# Patient Record
Sex: Female | Born: 1935 | Marital: Married
Health system: Southern US, Community
[De-identification: ages and names within clinical notes are randomized; demographics above are authoritative.]

---

## 2004-11-17 ENCOUNTER — Emergency Department: Payer: Self-pay | Admitting: Unknown Physician Specialty

## 2005-04-01 ENCOUNTER — Emergency Department: Payer: Self-pay | Admitting: Emergency Medicine

## 2005-04-01 ENCOUNTER — Other Ambulatory Visit: Payer: Self-pay

## 2005-08-30 ENCOUNTER — Emergency Department: Payer: Self-pay | Admitting: Emergency Medicine

## 2005-11-26 ENCOUNTER — Other Ambulatory Visit: Payer: Self-pay

## 2005-11-26 ENCOUNTER — Inpatient Hospital Stay: Payer: Self-pay | Admitting: Unknown Physician Specialty

## 2005-12-29 ENCOUNTER — Other Ambulatory Visit: Payer: Self-pay

## 2005-12-29 ENCOUNTER — Emergency Department: Payer: Self-pay | Admitting: Emergency Medicine

## 2006-01-07 ENCOUNTER — Other Ambulatory Visit: Payer: Self-pay

## 2006-01-07 ENCOUNTER — Emergency Department: Payer: Self-pay | Admitting: Emergency Medicine

## 2006-01-08 ENCOUNTER — Emergency Department: Payer: Self-pay | Admitting: Emergency Medicine

## 2006-01-08 ENCOUNTER — Other Ambulatory Visit: Payer: Self-pay

## 2006-05-03 ENCOUNTER — Ambulatory Visit: Payer: Self-pay | Admitting: Family Medicine

## 2006-05-12 ENCOUNTER — Ambulatory Visit: Payer: Self-pay | Admitting: Family Medicine

## 2006-06-20 ENCOUNTER — Ambulatory Visit: Payer: Self-pay | Admitting: Surgery

## 2006-07-12 ENCOUNTER — Ambulatory Visit: Payer: Self-pay | Admitting: Surgery

## 2006-07-19 ENCOUNTER — Ambulatory Visit: Payer: Self-pay | Admitting: Surgery

## 2006-07-29 ENCOUNTER — Emergency Department: Payer: Self-pay | Admitting: Emergency Medicine

## 2007-02-27 IMAGING — US US  BREAST BX W/ LOC DEV 1ST LESION IMG BX SPEC US GUIDE*R*
1 series · 17 of 17 positions shown · non-contrast
Comparison: none

REASON FOR EXAM: Rt Breast Mass
COMMENTS:

[Series 1: us breast bx w/ loc dev 1st lesion img bx spec us  · 17 of 17 slices shown]
[im 1/17]
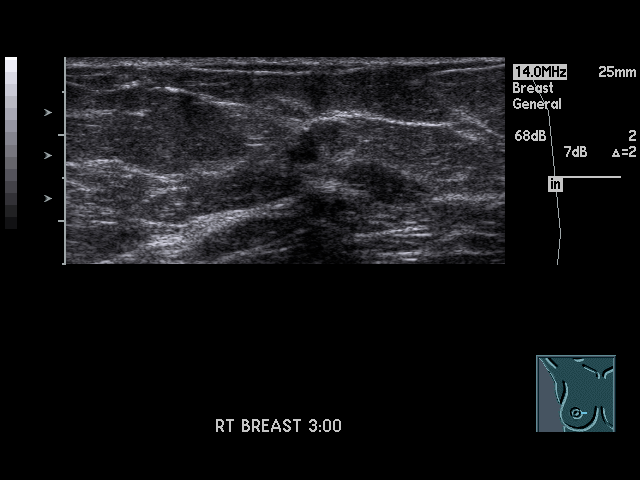
[im 2/17]
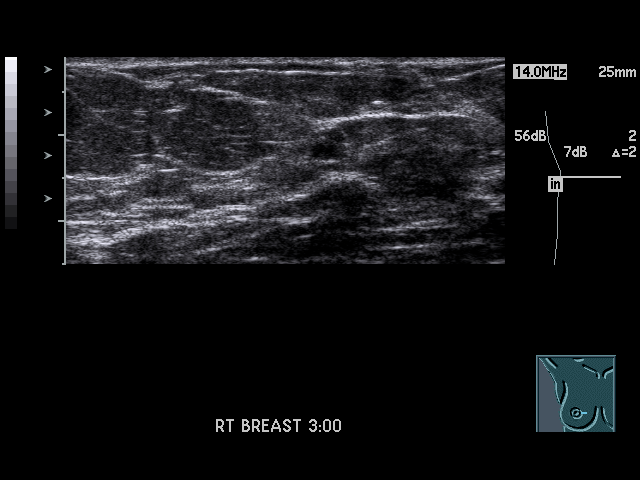
[im 3/17]
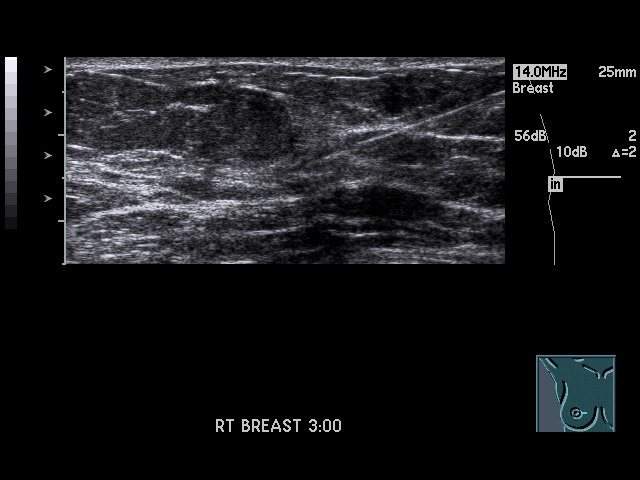
[im 4/17]
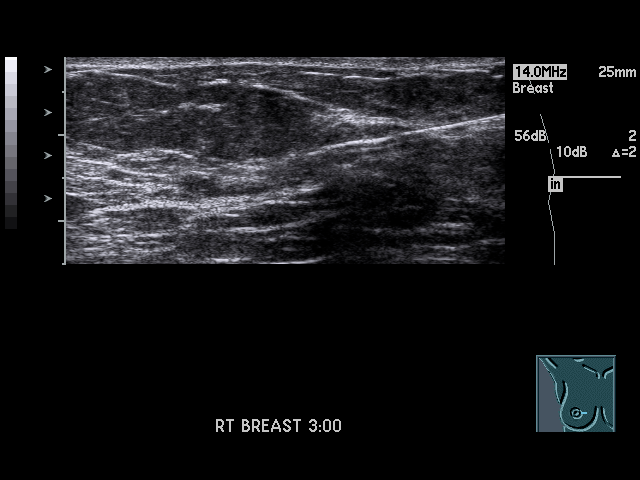
[im 5/17]
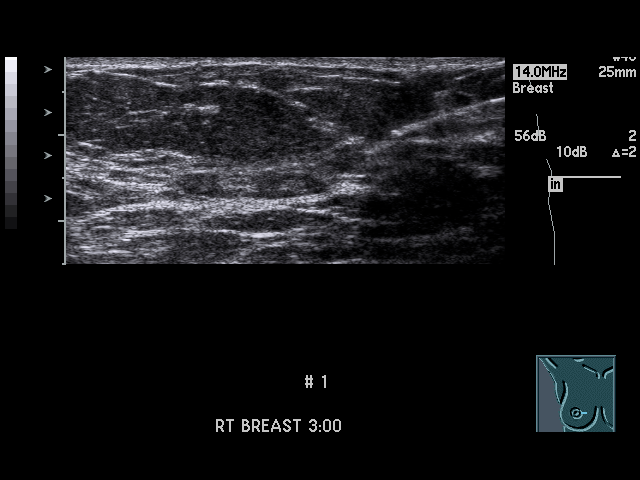
[im 6/17]
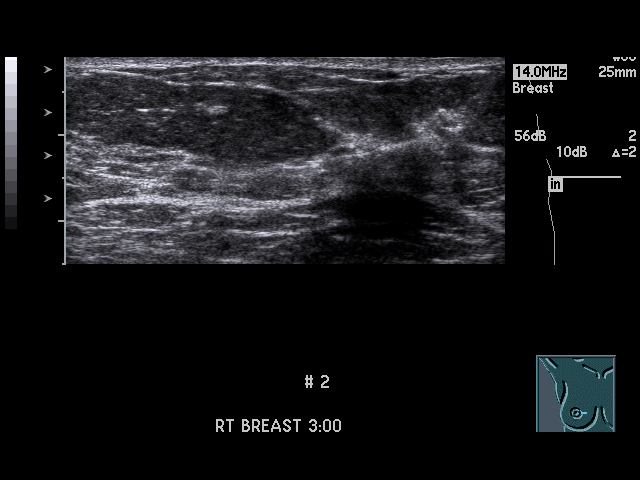
[im 7/17]
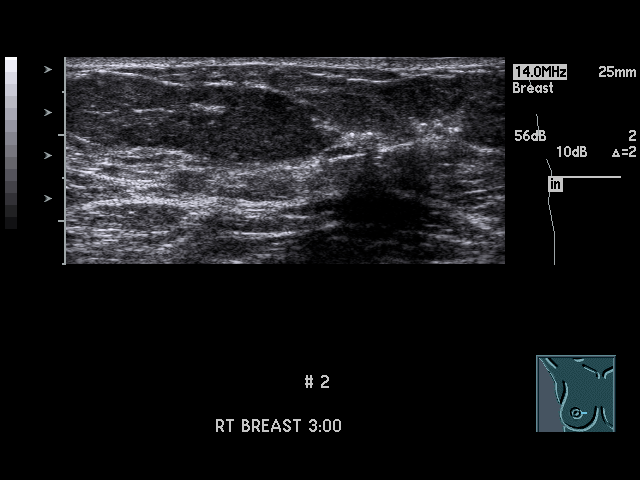
[im 8/17]
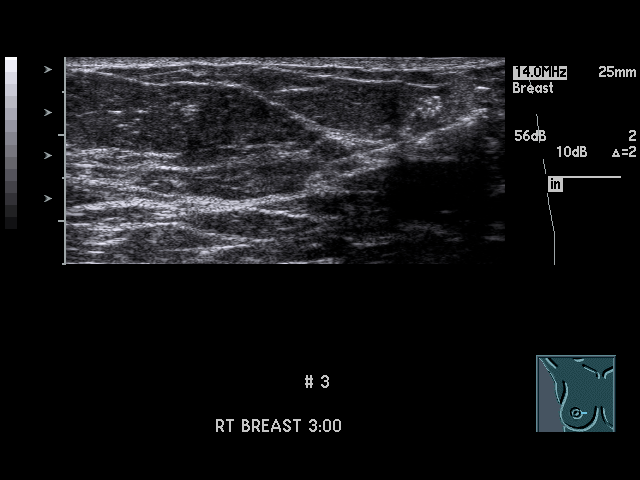
[im 9/17]
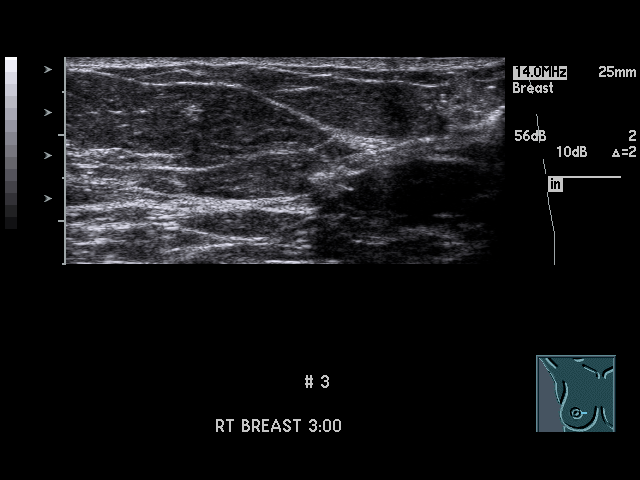
[im 10/17]
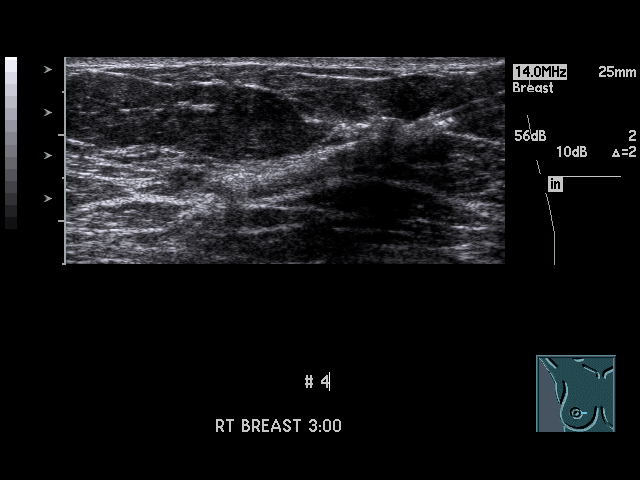
[im 11/17]
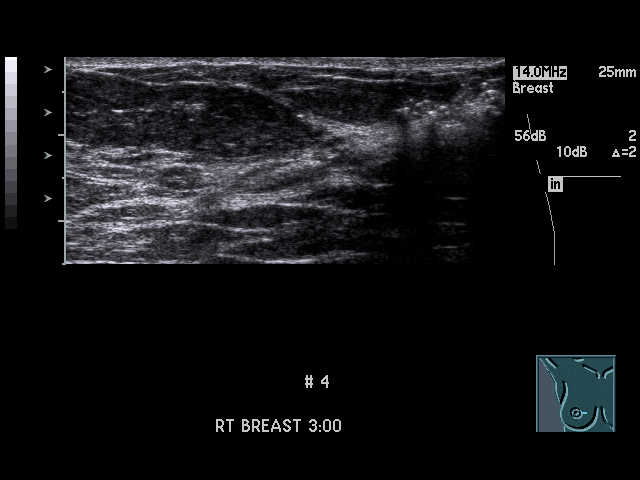
[im 12/17]
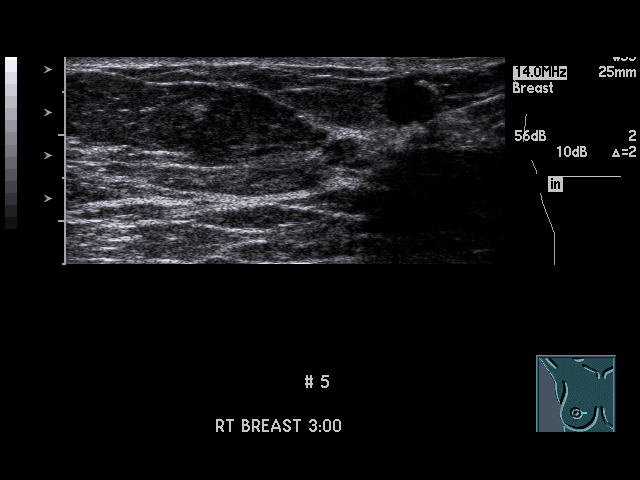
[im 13/17]
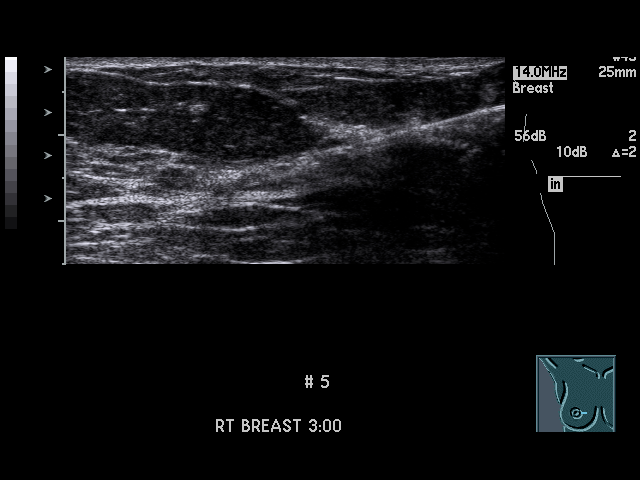
[im 14/17]
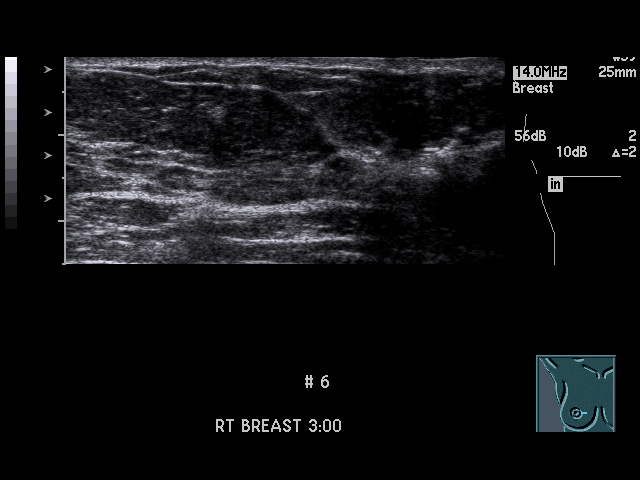
[im 15/17]
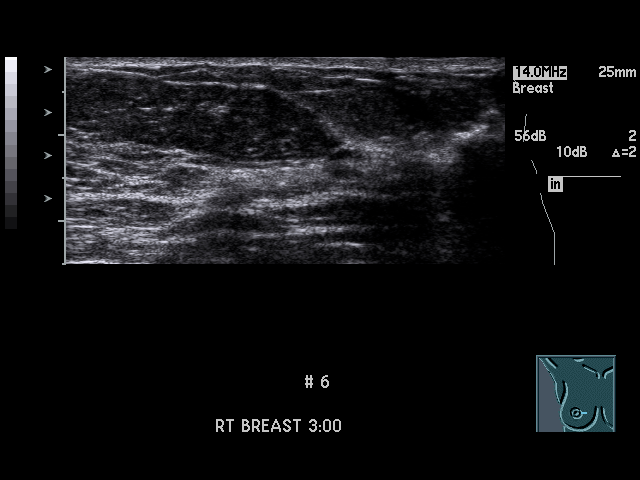
[im 16/17]
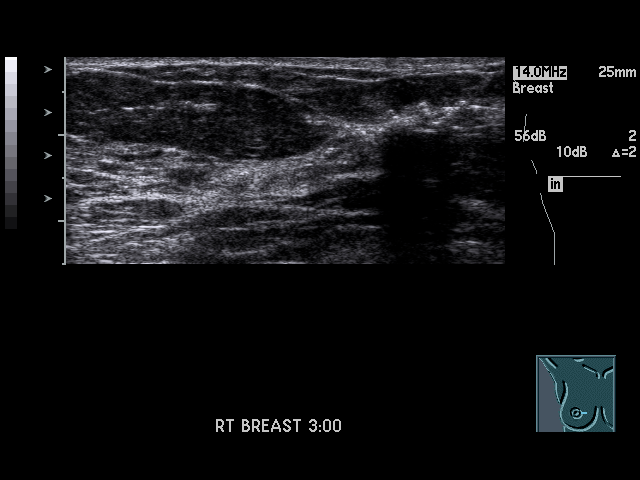
[im 17/17]
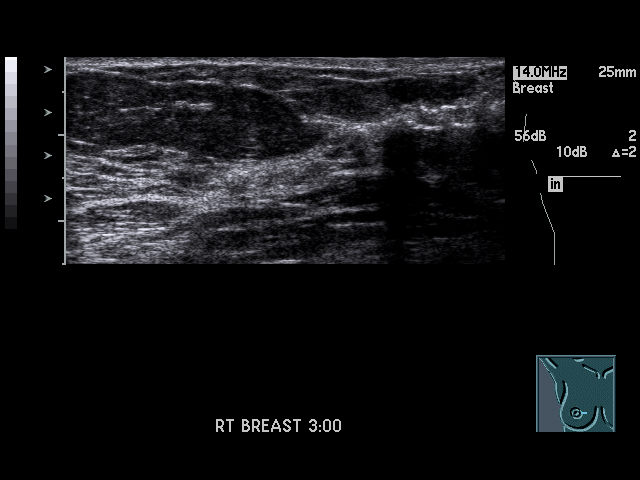

[17 of 17 positions shown; findings below may reference images not displayed]

PROCEDURE:     US  - US GUIDED BIOPSY BREAST RIGHT  - June 20, 2006  [DATE]

RESULT:     The patient was informed of the risks and benefits of the
procedure and proper informed consent was obtained.  The patient was brought
to the ultrasound suite and the RIGHT breast was evaluated.  A small
hypoechoic nodule is identified at the 3 o'clock position.  Proper entry
site for ultrasound guided biopsy and possible aspiration was established.
The overlying soft tissues were then prepped and draped in the usual sterile
fashion.  The entry site was anesthetized with approximately 6 cm of 1%
lidocaine without epinephrine.  A small dermatotomy was formed at the entry
site.  The small hypoechoic nodule was then cannulated with an 18-gauge 5-cm
Achieve spinal needle.  An attempt was made to aspirate the small hypoechoic
nodule which was unsuccessful.

Approximately 6 passes were made into the small hypoechoic nodule with a
16-gauge, 11-cm Achieve core biopsy needle.  The samples were sent to the
pathology lab for analysis.  The patient tolerated the procedure without
complications.  There is no evidence of free fluid or drainable loculated
fluid collections.  Small radiopaque as well as echogenic markers were
placed in the region of biopsy status post conclusion of the biopsy. The
patient tolerated the procedure without complications.
IMPRESSION: Ultrasound guided RIGHT breast biopsy as described above.

The patient tolerated the procedure without complications.  The patient was
monitored and discharged with an ice pack to apply to the biopsy site.

## 2008-04-02 ENCOUNTER — Emergency Department: Payer: Self-pay | Admitting: Emergency Medicine

## 2008-08-01 ENCOUNTER — Emergency Department: Payer: Self-pay | Admitting: Internal Medicine

## 2009-05-25 ENCOUNTER — Emergency Department: Payer: Self-pay | Admitting: Emergency Medicine

## 2009-12-26 ENCOUNTER — Emergency Department: Payer: Self-pay | Admitting: Unknown Physician Specialty

## 2010-11-09 ENCOUNTER — Emergency Department: Payer: Self-pay | Admitting: Emergency Medicine

## 2010-12-13 ENCOUNTER — Emergency Department: Payer: Self-pay | Admitting: Emergency Medicine

## 2011-03-18 ENCOUNTER — Emergency Department: Payer: Self-pay | Admitting: Emergency Medicine

## 2012-06-05 ENCOUNTER — Emergency Department: Payer: Self-pay | Admitting: Emergency Medicine

## 2012-06-05 LAB — COMPREHENSIVE METABOLIC PANEL
Albumin: 3.8 g/dL (ref 3.4–5.0)
Anion Gap: 8 (ref 7–16)
BUN: 30 mg/dL — ABNORMAL HIGH (ref 7–18)
Bilirubin,Total: 0.3 mg/dL (ref 0.2–1.0)
Chloride: 102 mmol/L (ref 98–107)
Co2: 26 mmol/L (ref 21–32)
EGFR (African American): >60
Glucose: 93 mg/dL (ref 65–99)
Osmolality: 278 (ref 275–301)
Potassium: 4.3 mmol/L (ref 3.5–5.1)
SGOT(AST): 22 U/L (ref 15–37)
SGPT (ALT): 25 U/L (ref 12–78)
Sodium: 136 mmol/L (ref 136–145)
Total Protein: 8.3 g/dL — ABNORMAL HIGH (ref 6.4–8.2)

## 2012-06-05 LAB — CBC
HCT: 40.6 % (ref 35.0–47.0)
HGB: 12.8 g/dL (ref 12.0–16.0)
MCH: 29.9 pg (ref 26.0–34.0)
Platelet: 233 10*3/uL (ref 150–440)
RBC: 4.27 10*6/uL (ref 3.80–5.20)
RDW: 13.8 % (ref 11.5–14.5)
WBC: 6.9 10*3/uL (ref 3.6–11.0)

## 2012-06-05 LAB — URINALYSIS, COMPLETE
Ketone: NEGATIVE
Nitrite: NEGATIVE
Ph: 7 (ref 4.5–8.0)
Protein: NEGATIVE
RBC,UR: 1 /HPF (ref 0–5)
Squamous Epithelial: 2
WBC UR: 24 /HPF (ref 0–5)

## 2012-06-05 LAB — CK TOTAL AND CKMB (NOT AT ARMC)
CK, Total: 42 U/L (ref 21–215)
CK-MB: <0.5 ng/mL — ABNORMAL LOW (ref 0.5–3.6)

## 2012-06-05 LAB — TROPONIN I: Troponin-I: <0.02 ng/mL

## 2013-02-17 LAB — COMPREHENSIVE METABOLIC PANEL
Albumin: 3.6 g/dL (ref 3.4–5.0)
Alkaline Phosphatase: 103 U/L (ref 50–136)
Anion Gap: 11 (ref 7–16)
Bilirubin,Total: 0.4 mg/dL (ref 0.2–1.0)
Calcium, Total: 9.6 mg/dL (ref 8.5–10.1)
Chloride: 103 mmol/L (ref 98–107)
Co2: 23 mmol/L (ref 21–32)
EGFR (African American): 55 — ABNORMAL LOW
Glucose: 101 mg/dL — ABNORMAL HIGH (ref 65–99)
Sodium: 137 mmol/L (ref 136–145)
Total Protein: 7.8 g/dL (ref 6.4–8.2)

## 2013-02-17 LAB — URINALYSIS, COMPLETE
Bacteria: NONE SEEN
Blood: NEGATIVE
Glucose,UR: NEGATIVE mg/dL (ref 0–75)
Leukocyte Esterase: NEGATIVE
Ph: 5 (ref 4.5–8.0)
Protein: NEGATIVE
RBC,UR: <1 /HPF (ref 0–5)
Squamous Epithelial: NONE SEEN

## 2013-02-17 LAB — CBC WITH DIFFERENTIAL/PLATELET
Basophil #: 0 10*3/uL (ref 0.0–0.1)
Basophil %: 0.1 %
HGB: 11.8 g/dL — ABNORMAL LOW (ref 12.0–16.0)
Lymphocyte #: 0.5 10*3/uL — ABNORMAL LOW (ref 1.0–3.6)
Lymphocyte %: 7.8 %
MCH: 30 pg (ref 26.0–34.0)
MCV: 91 fL (ref 80–100)
Monocyte #: 0 x10 3/mm — ABNORMAL LOW (ref 0.2–0.9)
Monocyte %: 0.7 %
Neutrophil #: 5.3 10*3/uL (ref 1.4–6.5)
RDW: 13.1 % (ref 11.5–14.5)
WBC: 5.8 10*3/uL (ref 3.6–11.0)

## 2013-02-17 LAB — APTT: Activated PTT: 28.6 secs (ref 23.6–35.9)

## 2013-02-17 LAB — PROTIME-INR: INR: 1

## 2013-02-17 LAB — TSH: Thyroid Stimulating Horm: 0.79 u[IU]/mL

## 2013-02-17 LAB — TROPONIN I: Troponin-I: 0.4 ng/mL — ABNORMAL HIGH

## 2013-02-18 ENCOUNTER — Inpatient Hospital Stay: Payer: Self-pay | Admitting: Family Medicine

## 2013-02-18 DIAGNOSIS — I214 Non-ST elevation (NSTEMI) myocardial infarction: Secondary | ICD-10-CM

## 2013-02-18 LAB — CBC WITH DIFFERENTIAL/PLATELET
Basophil #: 0 10*3/uL (ref 0.0–0.1)
Basophil %: 0.3 %
Eosinophil #: 0 10*3/uL (ref 0.0–0.7)
HGB: 9.5 g/dL — ABNORMAL LOW (ref 12.0–16.0)
Lymphocyte #: 0.3 10*3/uL — ABNORMAL LOW (ref 1.0–3.6)
Lymphocyte %: 2.2 %
MCH: 29.9 pg (ref 26.0–34.0)
MCHC: 33.4 g/dL (ref 32.0–36.0)
MCV: 89 fL (ref 80–100)
Monocyte #: 0.4 x10 3/mm (ref 0.2–0.9)
Monocyte %: 3.6 %
Neutrophil #: 10.9 10*3/uL — ABNORMAL HIGH (ref 1.4–6.5)
Neutrophil %: 93.9 %
RBC: 3.17 10*6/uL — ABNORMAL LOW (ref 3.80–5.20)
RDW: 12.9 % (ref 11.5–14.5)
WBC: 11.6 10*3/uL — ABNORMAL HIGH (ref 3.6–11.0)

## 2013-02-18 LAB — BASIC METABOLIC PANEL
Chloride: 104 mmol/L (ref 98–107)
Creatinine: 1.59 mg/dL — ABNORMAL HIGH (ref 0.60–1.30)
EGFR (African American): 33 — ABNORMAL LOW
Glucose: 134 mg/dL — ABNORMAL HIGH (ref 65–99)
Osmolality: 284 (ref 275–301)
Sodium: 137 mmol/L (ref 136–145)

## 2013-02-18 LAB — LIPID PANEL
Cholesterol: 138 mg/dL (ref 0–200)
Ldl Cholesterol, Calc: 74 mg/dL (ref 0–100)
Triglycerides: 97 mg/dL (ref 0–200)
VLDL Cholesterol, Calc: 19 mg/dL (ref 5–40)

## 2013-02-18 LAB — TROPONIN I: Troponin-I: 12.88 ng/mL — ABNORMAL HIGH

## 2013-02-19 DIAGNOSIS — R55 Syncope and collapse: Secondary | ICD-10-CM

## 2013-02-20 ENCOUNTER — Ambulatory Visit: Payer: Self-pay | Admitting: Internal Medicine

## 2013-02-20 DIAGNOSIS — R0602 Shortness of breath: Secondary | ICD-10-CM

## 2013-02-20 LAB — CBC WITH DIFFERENTIAL/PLATELET
Basophil #: 0 10*3/uL (ref 0.0–0.1)
Basophil %: 0.1 %
Eosinophil #: 0 10*3/uL (ref 0.0–0.7)
HGB: 11.7 g/dL — ABNORMAL LOW (ref 12.0–16.0)
Lymphocyte %: 5.4 %
MCV: 89 fL (ref 80–100)
Monocyte #: 0.5 x10 3/mm (ref 0.2–0.9)
Monocyte %: 3.7 %
Neutrophil #: 11.2 10*3/uL — ABNORMAL HIGH (ref 1.4–6.5)
Neutrophil %: 90.8 %
RBC: 3.92 10*6/uL (ref 3.80–5.20)
RDW: 13.3 % (ref 11.5–14.5)

## 2013-02-20 LAB — BASIC METABOLIC PANEL
BUN: 40 mg/dL — ABNORMAL HIGH (ref 7–18)
Calcium, Total: 8.6 mg/dL (ref 8.5–10.1)
Co2: 19 mmol/L — ABNORMAL LOW (ref 21–32)
Creatinine: 1.31 mg/dL — ABNORMAL HIGH (ref 0.60–1.30)
EGFR (African American): 42 — ABNORMAL LOW
EGFR (Non-African Amer.): 36 — ABNORMAL LOW
Glucose: 126 mg/dL — ABNORMAL HIGH (ref 65–99)
Osmolality: 283 (ref 275–301)
Potassium: 4.5 mmol/L (ref 3.5–5.1)

## 2013-02-20 LAB — TROPONIN I: Troponin-I: 5.08 ng/mL — ABNORMAL HIGH

## 2013-02-21 LAB — BASIC METABOLIC PANEL
BUN: 47 mg/dL — ABNORMAL HIGH (ref 7–18)
Calcium, Total: 8.9 mg/dL (ref 8.5–10.1)
Chloride: 106 mmol/L (ref 98–107)
Creatinine: 1.54 mg/dL — ABNORMAL HIGH (ref 0.60–1.30)
EGFR (African American): 34 — ABNORMAL LOW
EGFR (Non-African Amer.): 30 — ABNORMAL LOW
Glucose: 148 mg/dL — ABNORMAL HIGH (ref 65–99)
Osmolality: 293 (ref 275–301)
Potassium: 4.1 mmol/L (ref 3.5–5.1)
Sodium: 139 mmol/L (ref 136–145)

## 2013-03-23 ENCOUNTER — Ambulatory Visit: Payer: Self-pay | Admitting: Internal Medicine

## 2013-03-23 DEATH — deceased

## 2013-10-27 IMAGING — CT CT HEAD WITHOUT CONTRAST
1 series · 16 of 30 positions shown, 20 images · non-contrast
Comparison: none

REASON FOR EXAM: c/o headache, sleepy
COMMENTS:

[Series 2: soft tissue · axial · 0.42mm/px · z∈[-201,-56]mm · 16 of 33 slices shown, 20 images]
[im 2/33  brain]
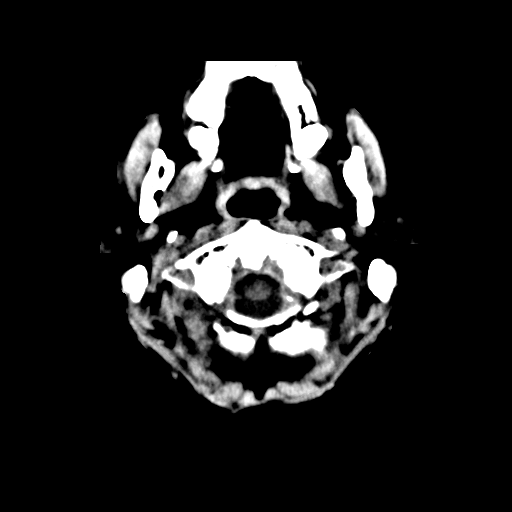
[im 2/33  bone]
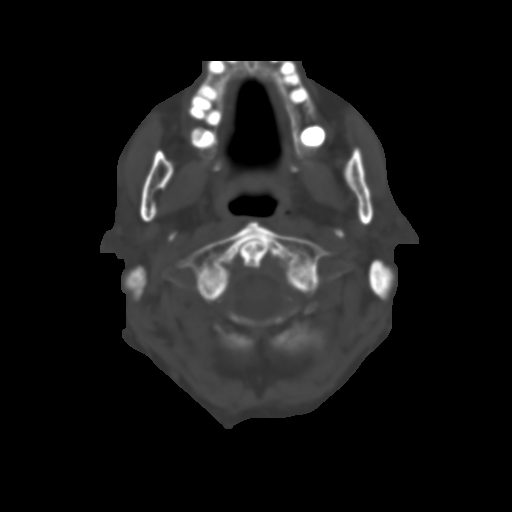
[im 4/33  brain]
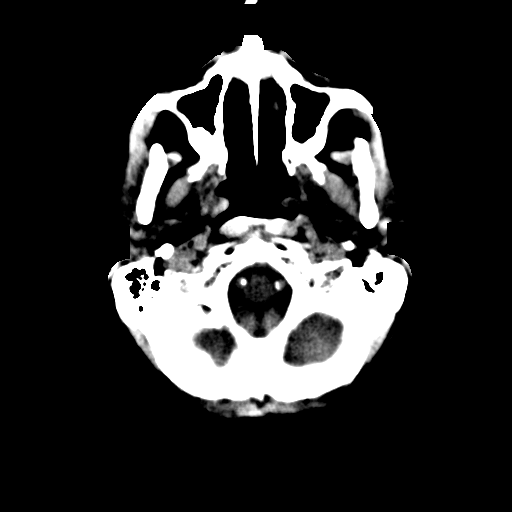
[im 6/33  brain]
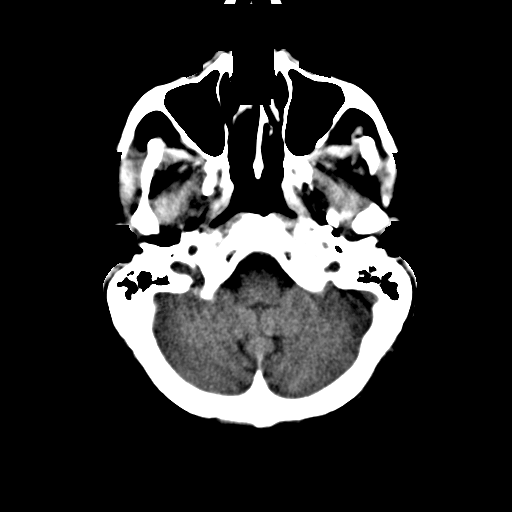
[im 8/33  brain]
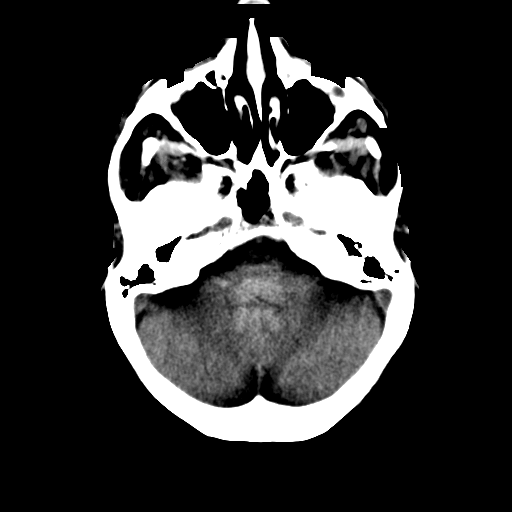
[im 9/33  brain]
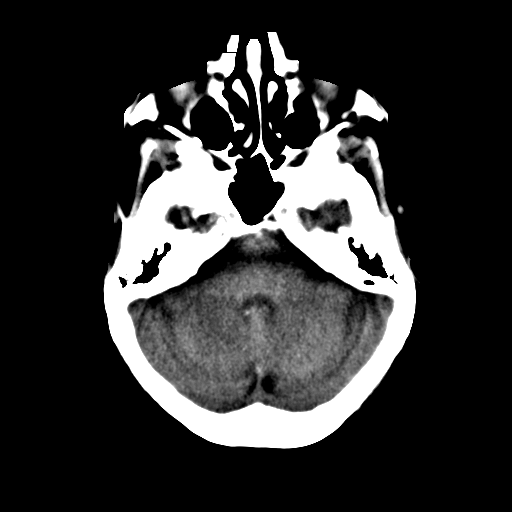
[im 9/33  bone]
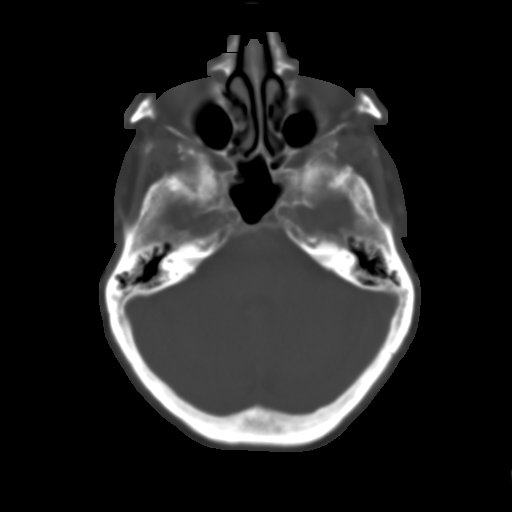
[im 12/33  brain]
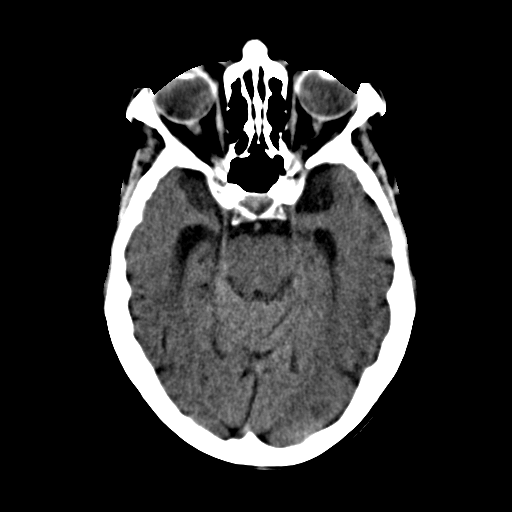
[im 14/33  brain]
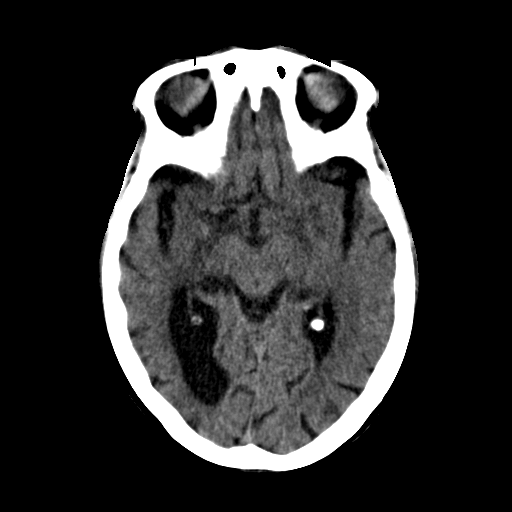
[im 16/33  brain]
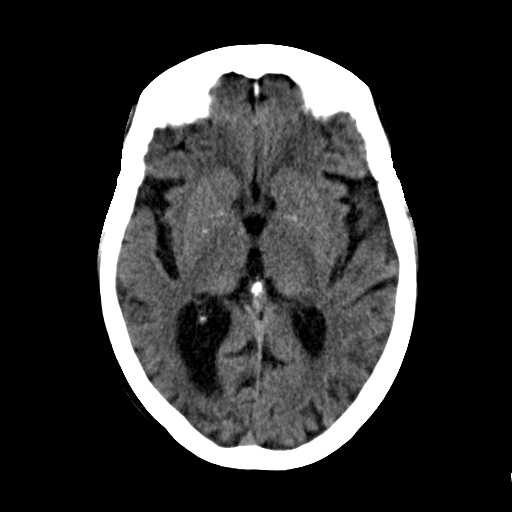
[im 17/33  brain]
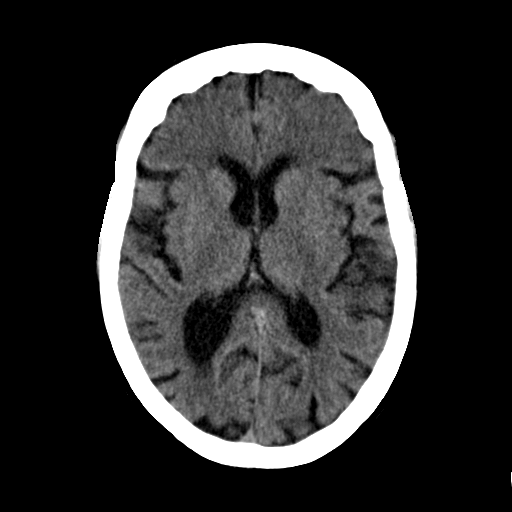
[im 17/33  bone]
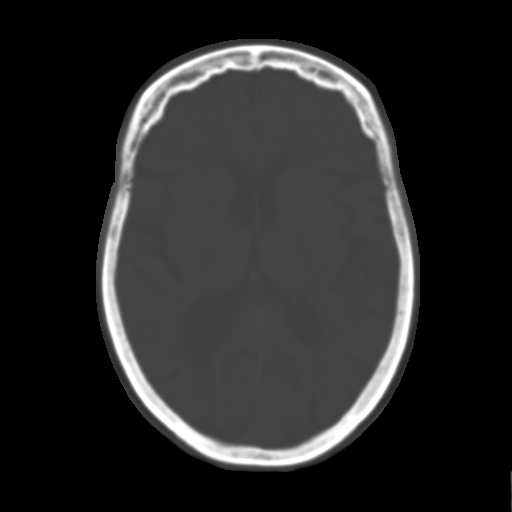
[im 19/33  brain]
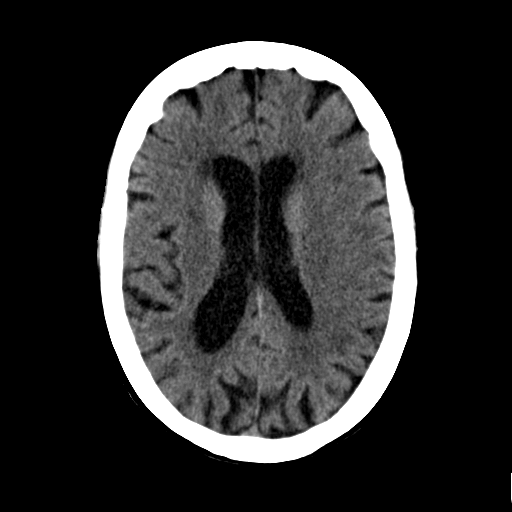
[im 21/33  brain]
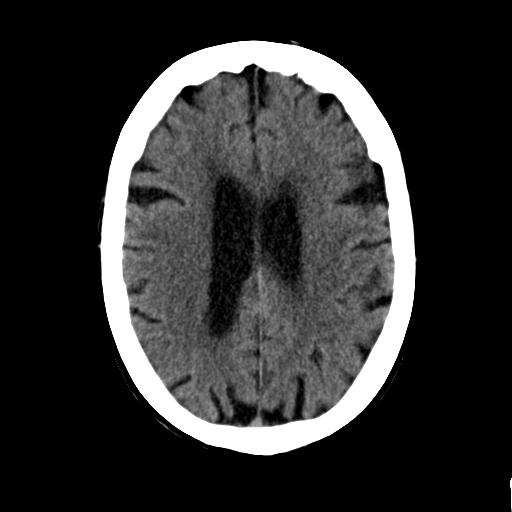
[im 24/33  brain]
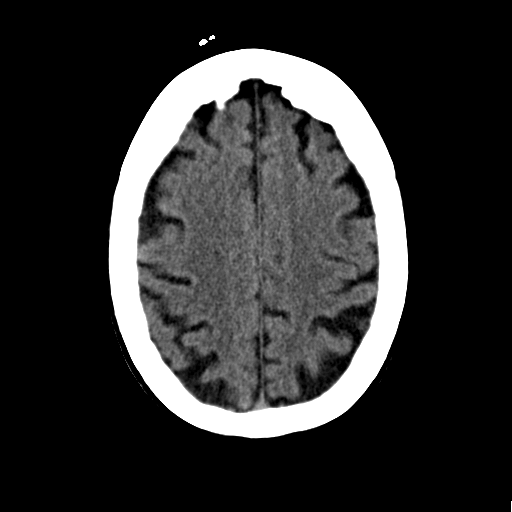
[im 25/33  brain]
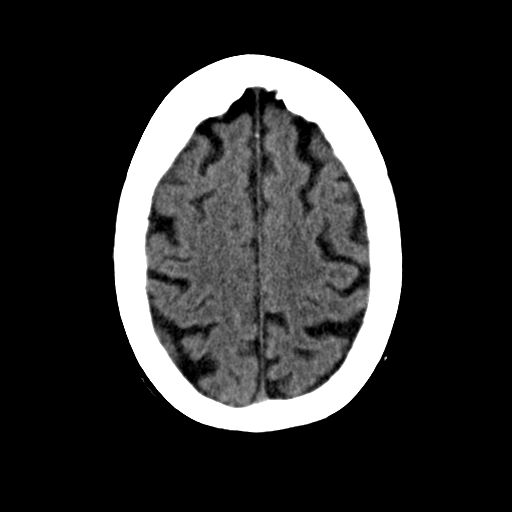
[im 25/33  bone]
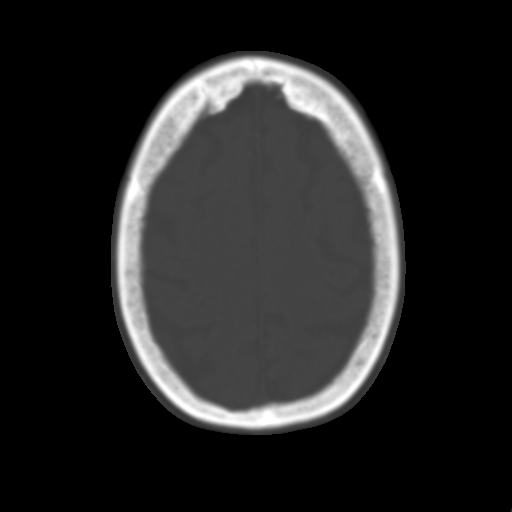
[im 27/33  brain]
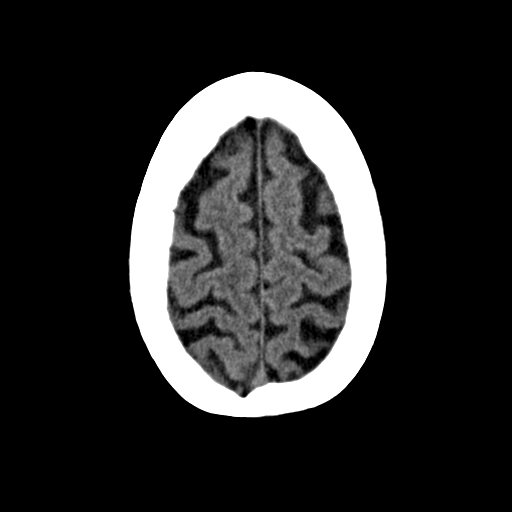
[im 29/33  brain]
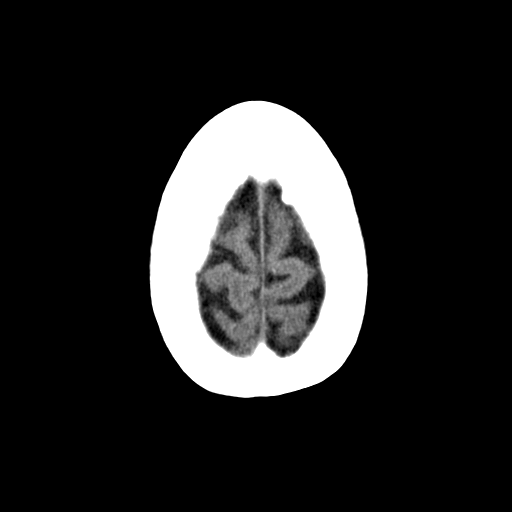
[im 31/33  brain]
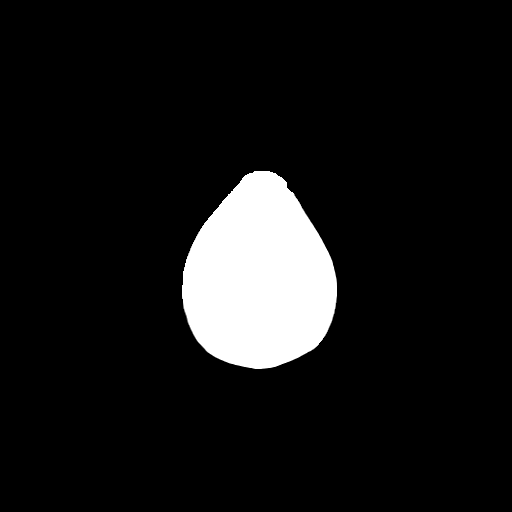

[16 of 30 positions shown; findings below may reference images not displayed]

PROCEDURE:     CT  - CT HEAD WITHOUT CONTRAST  - February 17, 2013  [DATE]

RESULT:     Noncontrast CT of the brain is compared to previous exam of
06/05/2012. There is prominence of the ventricles and sulci consistent with
atrophy. Low-attenuation is seen in the periventricular and subcortical
white matter. Basal ganglia calcification is seen symmetrically and appears
unchanged. There is no intracranial hemorrhage, mass, mass effect or
evolving infarct. The calvarium is intact. The sinuses are clear.
IMPRESSION: 1. No acute intracranial abnormality. Stable appearance.

[REDACTED]

## 2014-09-12 NOTE — Consult Note (Signed)
   Comments   Follow up visit made. Pt has been placed on 100% NRB as if refusing to keep it in place. Her stepson is at bedside and we discussed her current status. He recognizes that her prognosis may be poor. He asks that patient be made a DNR although he understands that we have to await a decision from DSS. He also asks that patient be kept comfortable. I again tried calling DSS. Will follow.   Electronic Signatures for Addendum Section:  Phifer, Harriett SineNancy (MD) (Signed Addendum 01-Oct-14 21:14)  Discussed current tx with stepson at bedside.   Electronic Signatures: Che Rachal, Daryl EasternJoshua R (NP)  (Signed 01-Oct-14 11:44)  Authored: Palliative Care   Last Updated: 01-Oct-14 21:14 by Phifer, Harriett SineNancy (MD)

## 2014-09-12 NOTE — Discharge Summary (Signed)
PATIENT NAME:  Andrea Yoder, Andrea A MR#:  [id number] DATE OF BIRTH:  1921-11-08  DATE OF ADMISSION:  02/18/2013 DATE OF DISCHARGE:  02/21/2013  DISPOSITION: Hospice home.   REASON FOR ADMISSION: Nausea and vomiting.   FINAL DIAGNOSES: 1.  Acute gastritis versus nausea and vomiting related to atypical presentation of myocardial infarction. 2.  Non-ST-elevation myocardial infarction.  3.  Stress induced cardiomyopathy.  4.  Acute respiratory failure due to pulmonary edema.  5.  Pulmonary edema.  6.  Congestive heart failure, acute, secondary to stress cardiomyopathy with severely depressed left ventricular ejection fraction of 20% to 25% with decreased global left ventricular function, akinesis over the  L ventricular wall seen on the echocardiogram. She also had diastolic dysfunction, mitral valve regurgitation and now aortic sclerosis without stenosis.   DISCHARGE MEDICATIONS: Only comfort measures. The patient is discharged on lorazepam and morphine orally.   IMPORTANT RESULTS: Echocardiogram mentioned above. Ejection fraction of 20% to 25%.  Troponins were elevated up to 12.88 due to non-ST-elevation MI. There were no changes on the EKG suggestive of acute ST MI. Mostly low voltage QRS, left axis deviation and a previous septal infarct. No ST depression seen. Kidney function with creatinine of 1.54 and a GFR around 30.   HOSPITAL COURSE: The patient was admitted with the chief complaint of nausea, vomiting and not feeling very well. She was 89. She has history of dementia, hypertension, osteoarthritis, hyperlipidemia and edema. She lived in [facility] assisted [facility] and her family was not able to make decisions for her because they were step-sons, but they were really involved her care, but they did not feel comfortable making any major decisions for what the patient went to have a guardian with the state. The patient was admitted as she had a borderline positive troponin. She was having acute  coronary syndrome and the presentation was atypical with nausea and vomiting. She was relatively hypotensive for what her medications were held. She was definitely confused, but that is her baseline. Cardiology was consulted. Dr. Mariah MillingGollan determined that the patient was having of course a non-ST-elevation MI, and because of her significant underlying dementia for condition, there was not indication for any major procedures like an ICD or catheterization.  She had stress induced cardiomyopathy with an ejection fraction of 20% to 25%, diffuse akinesis, and she has a large LAD infarct. The patient was hypotensive for what we were not able to use beta blockers. The patient was in pulmonary edema with chronic kidney disease and acute renal failure for what it was difficult to diurese. The patient was diuresed gently, put on BiPAP mask. Long discussions were obtained with the state for guardianship to change it to DNR as she was not doing very well. They decided to do that and at the end we were able to convert into hospice care as the patient was continued to struggle with her breathing. Again, there was no intervention able to be done based on her poor prognosis and weakness, not able to tolerate much, and also the medications were not able to be given based on the same facts. The patient is discharged to hospice home on high concentration of oxygen with hospice care and only comfort care.   TIME SPENT: I spent about 45 minutes with this discharge on the date of discharge.  ____________________________ Felipa Furnaceoberto Sanchez Gutierrez, MD rsg:sb D: 02/22/2013 06:52:26 ET T: 02/22/2013 07:06:19 ET JOB#: [id number]  cc: Felipa Furnaceoberto Sanchez Gutierrez, MD, <Dictator> Magalene Mclear Juanda ChanceSANCHEZ GUTIERRE MD ELECTRONICALLY SIGNED 02/26/2013  23:34 

## 2014-09-12 NOTE — H&P (Signed)
PATIENT NAME:  Andrea Yoder, Andrea Yoder MR#:  [id number] DATE OF BIRTH:  07/26/1921  DATE OF ADMISSION:  02/17/2013  PRIMARY CARE PHYSICIAN: Dr. Terance Hart.   CHIEF COMPLAINT: Sent in from [facility] for nausea, vomiting.   HISTORY OF PRESENT ILLNESS: This is an 81 year old female who is unable to give any history secondary to dementia. She was sent in for nausea, vomiting and they drew a troponin in the ER and it is borderline at 0.4. Hospitalist services were contacted for further evaluation. I asked her if she had any chest pain. She said no. I asked her if she had any shortness of breath. She said no. I asked her if she had any abdominal pain. She said no. She said she did get sick but unable to elaborate.   PAST MEDICAL HISTORY: Dementia, hypertension, osteoarthritis, hyperlipidemia and edema.   PAST SURGICAL HISTORY: Unknown. She does have a midline scar on the abdomen.   ALLERGIES: As per computer to [facility].   MEDICATIONS: Include acetaminophen 500 mg 2 tablets twice a day, alendronate 70 mg daily, amitriptyline/chlordiazepoxide 12.5/5 at bedtime, amlodipine 5 mg daily, aspirin 81 mg daily, calcium and vitamin D 600/400 international units chewable daily, furosemide 40 mg daily, lorazepam 1 mg every 6 hours as needed for agitation, losartan 100 mg daily, melatonin 3 mg at bedtime, potassium 20 mEq/15 mL oral liquid daily, rivastigmine 6 mg twice a day, tramadol 50 mg 3 times a day as needed for pain, vitamin D 1000 international units daily.   SOCIAL HISTORY: Unobtainable. Lives at [facility].   FAMILY HISTORY: Unobtainable secondary to dementia.   REVIEW OF SYSTEMS: Unobtainable secondary to dementia.   PHYSICAL EXAMINATION:  VITAL SIGNS: Temperature 97.7, pulse 105, respirations 16, blood pressure 102/69, pulse ox 95% on room air.  GENERAL: No respiratory distress.  EYES: Conjunctivae and lids normal. Unable to test extraocular muscles. Pupils reactive to light. EARS, NOSE, MOUTH AND  THROAT: Tympanic membranes: No erythema. Nasal mucosa: No erythema. Throat: No erythema, no exudate seen. Lips and gums: No lesions.  NECK: No JVD. No bruits. No lymphadenopathy. No thyromegaly. No thyroid nodules palpated.  RESPIRATORY: Lungs clear to auscultation. No use of accessory muscles to breathe. No rhonchi, rales or wheeze heard.  CARDIOVASCULAR: S1 and S2 normal. No gallops, rubs or murmurs heard. Carotid upstroke 2+ bilaterally. No bruits. Dorsalis pedis pulses 2+ bilaterally. No edema of the lower extremity.  ABDOMEN: Soft. The patient did not like me palpating her abdomen. Unclear if this is painful or not. No organo- or splenomegaly. Normoactive bowel sounds.  LYMPHATIC: No lymph nodes in the neck.  MUSCULOSKELETAL: No clubbing, edema or cyanosis.  SKIN: No rashes or ulcers seen.  NEUROLOGIC: Cranial nerves: Unable to test secondary to dementia. Deep tendon reflexes 1+ bilateral lower extremity. The patient is able to wiggle toes to command. Unable to lift her legs up off the bed.  PSYCHIATRIC: The patient is alert. Kept her eyes closed during the entire time I was with the patient.    LABORATORY AND RADIOLOGICAL DATA: Urinalysis negative. CT scan of the head showed no acute intracranial abnormality. White blood cell count 5.8, H and H 11.8 and 35.6, platelet count of 178. Glucose 101, BUN 35, creatinine 1.05, sodium 137, potassium 3.9, chloride 103, CO2 23, calcium 9.6. Liver function tests normal range. PT/INR normal range. Troponin borderline at 0.4. TSH 0.79. EKG showed a sinus tachycardia, PVCs, left axis deviation, Q waves septally.   ASSESSMENT AND PLAN:  1. Nausea, vomiting: Unclear etiology.  Will monitor overnight. P.r.n. nausea medications. Will give intravenous fluid hydration. Hold the Lasix at this point in time. The patient may be slightly dehydrated.  2. Elevated troponin: Will continue aspirin. The patient was given a rectal aspirin in the Emergency Room. Will continue  aspirin 81 mg daily. Blood pressure currently too low to start metoprolol. I doubt cardiology would do anything aggressive in this patient with dementia at this point in time. Can check with the guardian tomorrow on how aggressive they want to be.  3. Relative hypotension: Hold amlodipine at this point in time.  4. Dementia: Can continue rivastigmine. Try to hold medications that can cause altered mental status at this point.   TIME SPENT: 55 minutes.   ____________________________ Herschell Dimesichard J. Renae GlossWieting, MD rjw:gb D: 02/17/2013 22:26:24 ET T: 02/17/2013 23:11:26 ET JOB#: [id number]  cc: Herschell Dimesichard J. Renae GlossWieting, MD, <Dictator> Teena Iraniavid M. Terance HartBronstein, MD Salley ScarletICHARD J Malkia Nippert MD ELECTRONICALLY SIGNED 02/20/2013 13:47

## 2014-09-12 NOTE — Consult Note (Signed)
General Aspect Andrea Yoder is a 81yo female w/ PMHx s/f Alzheimer's dementia, h/o syncope, HTN, HLD, lypmhedema (per history) and osteoarthritis who was admitted to [facility] yesterday for nausea/vomiting. Troponin trend positive for NSTEMI.   History is limited due the patient's underlying dementia. She is a resident at [facility] and was sent to the ED for nausea/vomiting. Further description of these symptoms is unavailable. She denies chest pain, shortness of breath, abdominal pain, nausea/vomiting or abnormal bleeding. No family member to assist with the history.   Present Illness In the ED, EKG revealed NST, no ST/T changes. Initial TnI mildly elevated at 0.40. CMET revealed a mild azotemia (BUN 35). TSH WNL. CBC- Hgb 11.8/Hct 35.6. U/a w/o evidence of UTI. CXR demonstrated no acute abnormality. Noncontrast head CT w/o acute intracranial process. She was admitted by the medicine team for further management. She was hydrated and Zofran PRN was added. Repeat TnI today is markedly elevated at 8.90. CBC- Hgb 9.5/Hct 28.3, WBC 11.6.  PAST MEDICAL HISTORY: Dementia, hypertension, osteoarthritis, hyperlipidemia and edema.   PAST SURGICAL HISTORY: Unknown. She does have a midline scar on the abdomen.   ALLERGIES: As per computer to [facility].   SOCIAL HISTORY: Unobtainable. Lives at [facility].   FAMILY HISTORY: Unobtainable secondary to dementia.   REVIEW OF SYSTEMS: Unobtainable secondary to dementia.   Physical Exam:  GEN no acute distress, obese   HEENT pink conjunctivae, PERRL, hearing intact to voice   NECK supple  No masses  trachea midline   RESP normal resp effort  no use of accessory muscles  bibasilar rales appreciated   CARD Regular rate and rhythm  Normal, S1, S2  No murmur   ABD denies tenderness  soft  normal BS   EXTR negative cyanosis/clubbing, bilateral nonpitting pretibial edema   SKIN normal to palpation, No rashes   NEURO motor/sensory function intact   PSYCH A&O x 1  to self   Review of Systems:  Subjective/Chief Complaint nausea/vomiting   ROS Pt not able to provide ROS  dementia   Home Medications: Medication Instructions Status  alendronate 70 mg oral tablet 1 tab(s) orally once a week in the early morning on Wednesday before foods/meds with water (osteoporosis) (7 am) **do not lie down for 30 minutes** Active  amlodipine 5 mg oral tablet 1 tab(s) orally once a day for high blood pressure (8 am) Active  calcium-vitamin D 600 mg-400 intl units oral tablet, chewable 1 tab(s) orally once a day (8 am) Active  aspirin 81 mg oral tablet, chewable 1 tab(s) orally once a day (8 am) Active  furosemide 40 mg oral tablet 1 tab(s) orally once a day (8 am) Active  losartan 100 mg oral tablet 1 tab(s) orally once a day for high blood pressure (8 am) Active  Vitamin D3 1000 intl units oral tablet 1 tab(s) orally once a day (8 am) Active  acetaminophen 500 mg oral tablet 2 tab(s) orally 2 times a day (8 am, 8 pm) Active  rivastigmine 6 mg oral capsule 1 cap(s) orally 2 times a day (8 am, 5 pm) **do not crush** Active  amitriptyline-chlordiazepoxide 12.5 mg-5 mg oral tablet 2 tab(s) orally once a day (at bedtime) (control) (8 pm) Active  melatonin 3 mg oral tablet 1 tab(s) orally once a day (at bedtime) for insomnia (8 pm) Active  lorazepam 1 mg oral tablet 1 tab(s) orally every 6 hours, As Needed- for Agitation (control) Active  tramadol 50 mg oral tablet 1 tab(s) orally 3 times  a day, As Needed- for Pain  Active  potassium chloride 20 mEq/15 mL oral liquid 7.5 milliliter(s) orally once a day Active  potassium bicarbonate-potassium chloride 20 milliequivalent(s) orally once a day Active   Lab Results:  Routine Chem:  29-Sep-14 03:16   BUN  36  Creatinine (comp)  1.59  Cardiac:  28-Sep-14 19:21   Troponin I  0.40 (0.00-0.05 0.05 ng/mL or less: NEGATIVE  Repeat testing in 3-6 hrs  if clinically indicated. >0.05 ng/mL: POTENTIAL  MYOCARDIAL INJURY. Repeat   testing in 3-6 hrs if  clinically indicated. NOTE: An increase or decrease  of 30% or more on serial  testing suggests a  clinically important change)  29-Sep-14 03:16   Troponin I  8.90 (0.00-0.05 0.05 ng/mL or less: NEGATIVE  Repeat testing in 3-6 hrs  if clinically indicated. >0.05 ng/mL: POTENTIAL  MYOCARDIAL INJURY. Repeat  testing in 3-6 hrs if  clinically indicated. NOTE: An increase or decrease  of 30% or more on serial  testing suggests a  clinically important change)  Routine Hem:  28-Sep-14 19:21   Hemoglobin (CBC)  11.8  Hematocrit (CBC) 35.6  29-Sep-14 03:16   WBC (CBC)  11.6  Hemoglobin (CBC)  9.5  Hematocrit (CBC)  28.3   EKG:  Interpretation NSR, widened QRS, Q waves V1, V2 vs poor R wave progression, LAD, no ST/T changes   Rate 102    Macrobid: Unknown  Vital Signs/Nurse's Notes: **Vital Signs.:   29-Sep-14 08:07  Vital Signs Type Routine  Temperature Temperature (F) 97.2  Celsius 36.2  Temperature Source oral  Pulse Pulse 93  Respirations Respirations 18  Systolic BP Systolic BP 94  Diastolic BP (mmHg) Diastolic BP (mmHg) 68  Mean BP 76  Pulse Ox % Pulse Ox % 95  Pulse Ox Activity Level  At rest  Oxygen Delivery Room Air/ 21 %    Impression 81yo female w/ PMHx s/f Alzheimer's dementia, h/o syncope, HTN, HLD, lypmhedema (per history) and osteoarthritis who was admitted to [facility] yesterday for nausea/vomiting. Troponin trend positive for NSTEMI.   1. NSTEMI The patient was sent from her ALF for nausea/vomiting of unclear duration or consistency. History is markedly limited by the patient's underlying dementia. TnI trend 0.40->8.90 overnight. She denies chest pain. Bibasilar rales noted on exam. Mild leukocytosis may be reactive secondary to ischemia. Afebrile. No cough. CXR w/o evidence of consolidation. EKG yesterday indicates no ST/T changes. QRS enlarged diffusely c/w IVCD. Suspect risk outweighs benefits of cath at this point as  revascularization would likely provide minimal improvement to her baseline functional capacity. She is asymptomatic and in no acute distress.  -- Continue low-dose ASA -- Add NTG SL PRN -- Check repeat EKG this AM  2. Cardiomyopathy, stress-induced vs ischemic EF 20-25%, diffuse WMAs c/w stress-induced CM (less likely large LAD infarct), mild MR/TR, AV sclerosis -- Add low-dose carvedilol w/ hold parameters -- Monitor I/Os, daily weights. Not profoundly fluid overloaded on exam. LE edema non-pitting -- Hold on diuretics with given renal insufficiency ? hypovolemia/prerenal from n/v. Fairly euvolemic on exam.   3. Acute renal insufficiency BUN 36/Cr 1.59 today. Mildly tachycardic this AM. Suspect hypovolemia. IVF started per primary team. -- Continue to monitor with hydration.  -- Avoid nephrotoxic meds -- Treat nausea/emesis -- Consider echo to r/o cardiorenal if renal function fails to improve with hydration and volume trending up   Plan 4. Normocytic anemia The patient had a substantia drop in Hgb from 11.8->9.5 overnight. BUN 36/Cr 1.59. Question prerenal  azotemia given nausea/vomiting. Would expect hemoconcention if that is the case. Concern for GIB.  -- Check FOBT  5. Alzheimer's dementia Continue outpatient medications. Baseline cognitive and functional capacity limited. Avoid aggressive intervention.  6. HTN Well-controlled.  -- Continue to monitor with low-dose BB therapy  7. HLD Question benefit of adding a statin in this 81yo female with advanced dementia   Electronic Signatures for Addendum Section:  Lorine BearsArida, Muhammad (MD) (Signed Addendum 29-Sep-14 13:25)  The patient was seen and examined. Agree with the above. She has advanced dementia. Presented with NSTEMI . No murmurs by exam. ECG suggestive of anterior MI (more pronouced than previous ECG). Echo showed EF of 20-25% with WMA suggestive of stress induced cardiomyopathy (or less likely large LAD infarct).  I had a  prolonged discussion with patient and family (step son and step daughter). Due to age, dementia and comorbidites, I recommend medical therapy and avoiding invasvie procedures. Continue Aspirin. Can stop anticoagulation tomorrow. Will start small dose Coreg. ACE I if BP tolerated.  DNR status is appropriate. Family is requesting this but they don't have POA. Will discuss with Child psychotherapistsocial worker.   Electronic Signatures: Odella AquasArguello, Kaveon Blatz A (PA-C)  (Signed 29-Sep-14 13:13)  Authored: General Aspect/Present Illness, History and Physical Exam, Review of System, Home Medications, Labs, EKG , Allergies, Vital Signs/Nurse's Notes, Impression/Plan Lorine BearsArida, Muhammad (MD)  (Signed 29-Sep-14 13:25)  Co-Signer: General Aspect/Present Illness, Home Medications, Allergies   Last Updated: 29-Sep-14 13:25 by Lorine BearsArida, Muhammad (MD)

## 2014-09-12 NOTE — Consult Note (Signed)
   Comments   Had a conversation with Tona SensingLinda Allison and Cleotis NipperBob Cauthren with DSS. They agree with DNR. Will place order and complete portable form to reflect this decision.   Electronic Signatures for Addendum Section:  Phifer, Harriett SineNancy (MD) (Signed Addendum 01-Oct-14 21:15)  Follow up visit made. Family at bedside. Pt appears more comfortable. Now DNR.   Electronic Signatures: Borders, Daryl EasternJoshua R (NP)  (Signed 01-Oct-14 13:40)  Authored: Palliative Care   Last Updated: 01-Oct-14 21:15 by Phifer, Harriett SineNancy (MD)
